# Patient Record
Sex: Male | Born: 1995 | Race: Black or African American | Marital: Single
Health system: Southern US, Community
[De-identification: ages and names within clinical notes are randomized; demographics above are authoritative.]

---

## 2020-04-18 ENCOUNTER — Ambulatory Visit: Payer: Self-pay

## 2020-04-18 ENCOUNTER — Ambulatory Visit (INDEPENDENT_AMBULATORY_CARE_PROVIDER_SITE_OTHER): Payer: BC Managed Care – PPO | Admitting: Orthopedic Surgery

## 2020-04-18 DIAGNOSIS — S83511A Sprain of anterior cruciate ligament of right knee, initial encounter: Secondary | ICD-10-CM | POA: Diagnosis not present

## 2020-04-18 DIAGNOSIS — S83512A Sprain of anterior cruciate ligament of left knee, initial encounter: Secondary | ICD-10-CM

## 2020-04-18 DIAGNOSIS — M25561 Pain in right knee: Secondary | ICD-10-CM | POA: Diagnosis not present

## 2020-04-19 ENCOUNTER — Telehealth: Payer: Self-pay | Admitting: Orthopedic Surgery

## 2020-04-19 NOTE — Telephone Encounter (Signed)
Heather from [facility] called and would like the notes from his doctor visit faxed to [phone]

## 2020-04-19 NOTE — Telephone Encounter (Signed)
Will fax once dictation is available.

## 2020-04-20 ENCOUNTER — Encounter: Payer: Self-pay | Admitting: Orthopedic Surgery

## 2020-04-20 NOTE — Telephone Encounter (Signed)
faxed

## 2020-04-20 NOTE — Progress Notes (Signed)
Office Visit Note   Patient: Marvin Russo           Date of Birth: August 13, 1995           MRN: [id number] Visit Date: 04/18/2020 Requested by: Reather Laurence, DO [address] [place],  [place] [address] PCP: Reather Laurence, DO  Subjective: Chief Complaint  Patient presents with  . Right Knee - Injury    HPI: Marvin Russo is a 25 year old patient who injured his right knee 2 weeks ago.  He injured it playing soccer.  States that his knee buckled inward.  Noncontact injury.  Describes weakness giving way as well as some episodes of instability.  He does like to play soccer and basketball.  He has not played any soccer since his injury.  He is unable to run due to weakness.  Does describe swelling in the right knee after the injury.  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  Notably he also reports left knee injury 2 years ago with similar circumstances.  Does report some occasional pain and symptoms in the left knee.              ROS: All systems reviewed are negative as they relate to the chief complaint within the history of present illness.  Patient denies  fevers or chills.   Assessment & Plan: Visit Diagnoses:  1. Right knee pain, unspecified chronicity   2. 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  Patient is very active.  Reasonably high chance of having meniscal pathology based on the amount of activity he has been doing at least on the left knee with ACL deficiency.  Plan is bilateral knee MRI to evaluate for ACL and meniscal damage.  No posterior lateral rotatory instability today.  Follow-up after the studies.  Strongly advised against any cutting or pivoting activities until return office visit  Follow-Up Instructions: Return for after MRI.   Orders:  Orders Placed This Encounter  Procedures  . XR KNEE 3 VIEW RIGHT  . MR  Knee Right w/o contrast  . MR Knee Left w/o contrast   No orders of the defined types were placed in this encounter.     Procedures: No procedures performed   Clinical Data: No additional findings.  Objective: Vital Signs: There were no vitals taken for this visit.  Physical Exam:   Constitutional: Patient appears well-developed HEENT:  Head: Normocephalic Eyes:EOM are normal Neck: Normal range of motion Cardiovascular: Normal rate Pulmonary/chest: Effort normal Neurologic: Patient is alert Skin: Skin is warm Psychiatric: Patient has normal mood and affect    Ortho Exam: Ortho exam demonstrates ACL laxity in both knees.  Mild effusion right knee no effusion left knee.  Collaterals are stable symmetrically at 0 and 30 degrees.  Extensor mechanism is intact.  Pedal pulses intact bilaterally.  No definite posterior lateral rotatory instability present in either knee.  Does have medial greater than lateral joint line tenderness on the right.  Negative McMurray compression testing bilaterally.  Specialty Comments:  No specialty comments available.  Imaging: No results found.   PMFS History: There are no problems to display for this patient.  History reviewed. No pertinent past medical history.  History reviewed. No pertinent family history.  History reviewed. No pertinent surgical history. Social History   Occupational History  . Not on file  Tobacco Use  . Smoking status: Not on file  . Smokeless tobacco: Not on file  Substance and Sexual Activity  . Alcohol use: Not on file  . Drug use: Not on file  . Sexual activity: Not on file

## 2020-05-08 ENCOUNTER — Ambulatory Visit
Admission: RE | Admit: 2020-05-08 | Discharge: 2020-05-08 | Disposition: A | Discharge status: Home / Self Care | Payer: BC Managed Care – PPO | Source: Ambulatory Visit | Attending: Orthopedic Surgery | Admitting: Orthopedic Surgery

## 2020-05-08 ENCOUNTER — Other Ambulatory Visit: Payer: Self-pay

## 2020-05-08 DIAGNOSIS — M25561 Pain in right knee: Secondary | ICD-10-CM

## 2020-05-08 DIAGNOSIS — S83512A Sprain of anterior cruciate ligament of left knee, initial encounter: Secondary | ICD-10-CM

## 2020-05-08 DIAGNOSIS — S83511A Sprain of anterior cruciate ligament of right knee, initial encounter: Secondary | ICD-10-CM

## 2020-05-09 ENCOUNTER — Telehealth: Payer: Self-pay | Admitting: Orthopedic Surgery

## 2020-05-09 NOTE — Telephone Encounter (Signed)
Patient called needing results of his MRI sooner than 05/23/2020. The number to contact patient is [phone]

## 2020-05-10 NOTE — Telephone Encounter (Signed)
Appt tomorrow the 6th 1:45

## 2020-05-10 NOTE — Telephone Encounter (Signed)
See below. Are we able to get him in anywhere sooner?

## 2020-05-10 NOTE — Telephone Encounter (Signed)
Tried calling pt back to get in sooner, no vm will call back later

## 2020-05-11 ENCOUNTER — Ambulatory Visit: Payer: BC Managed Care – PPO | Admitting: Orthopedic Surgery

## 2020-05-11 ENCOUNTER — Telehealth: Payer: Self-pay

## 2020-05-11 NOTE — Telephone Encounter (Signed)
Please advise 

## 2020-05-11 NOTE — Telephone Encounter (Signed)
Call pt regarding MRI results.  Missed appt

## 2020-05-11 NOTE — Telephone Encounter (Signed)
I called and left message on his machine.  Told him his diagnoses from both MRI scans.  Instructed him to call back if he wants to proceed with intervention or further discussion.  Thanks

## 2020-05-12 ENCOUNTER — Telehealth: Payer: Self-pay | Admitting: Orthopedic Surgery

## 2020-05-12 NOTE — Telephone Encounter (Signed)
Patient called advised he would like to proceed with intervention and also want to further discuss.  Patient said he want to discuss surgery. The number to contact patient is [phone]

## 2020-05-12 NOTE — Telephone Encounter (Signed)
His choice but better to come in imo

## 2020-05-12 NOTE — Telephone Encounter (Signed)
Would you like patient to come in for appt  to review study and discuss SU or would you like to call him & discuss?

## 2020-05-13 NOTE — Telephone Encounter (Signed)
I spoke with patient. He is wanting results over the phone. Can either of you call him to advise?

## 2020-05-13 NOTE — Telephone Encounter (Signed)
lmom 

## 2020-05-23 ENCOUNTER — Ambulatory Visit: Payer: BC Managed Care – PPO | Admitting: Orthopedic Surgery

## 2022-07-24 IMAGING — MR MR KNEE*L* W/O CM
5 of 7 series · 23 of 40 positions shown · non-contrast
Comparison: None.

CLINICAL DATA: Soccer injury

EXAM:
MRI OF THE LEFT KNEE WITHOUT CONTRAST
TECHNIQUE: Multiplanar, multisequence MR imaging of the knee was performed. No
intravenous contrast was administered.

[Series 4: T2 fat-sat · axial · 4.0mm · 0.59mm/px · z∈[-52,+87]mm · 6 of 29 slices shown (1 of 2)]
[im 1/29]
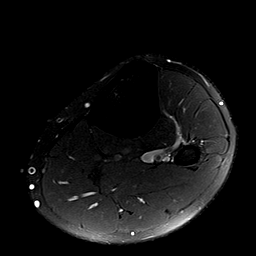
[im 6/29]
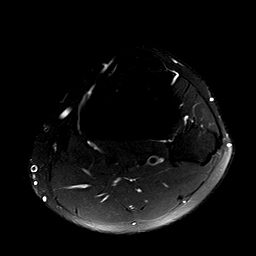
[im 12/29]
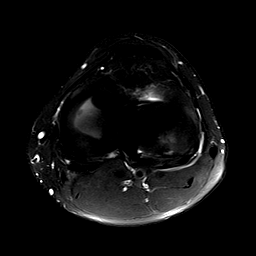
[im 17/29]
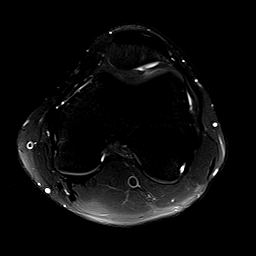
[im 23/29]
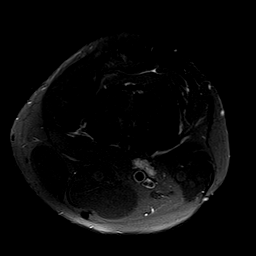
[im 29/29]
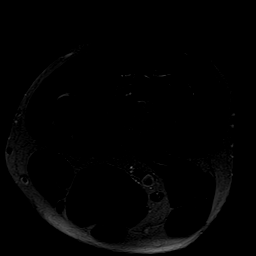

[Series 6: T2 fat-sat · coronal · 4.0mm · 0.29mm/px · 2 of 29 slices shown (2 of 2)]
[im 1/29]
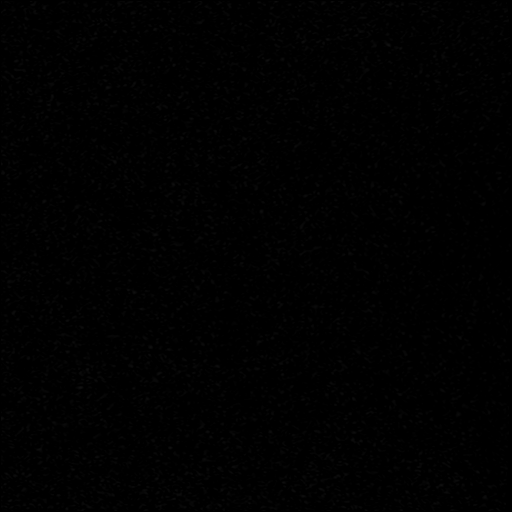
[im 6/29]
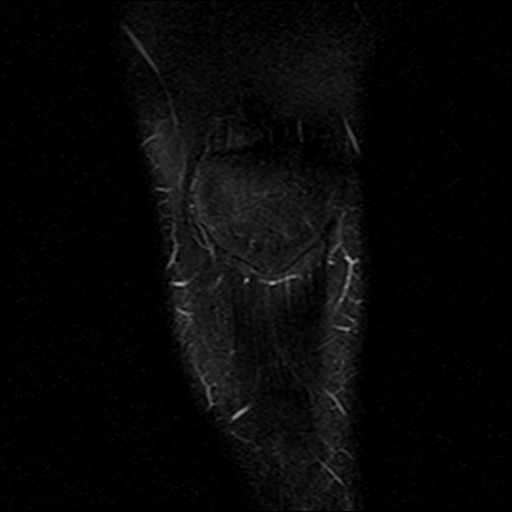

[Series 7: PD fat-sat · coronal · 4.0mm · 0.29mm/px · 6 of 29 slices shown (1 of 3)]
[im 1/29]
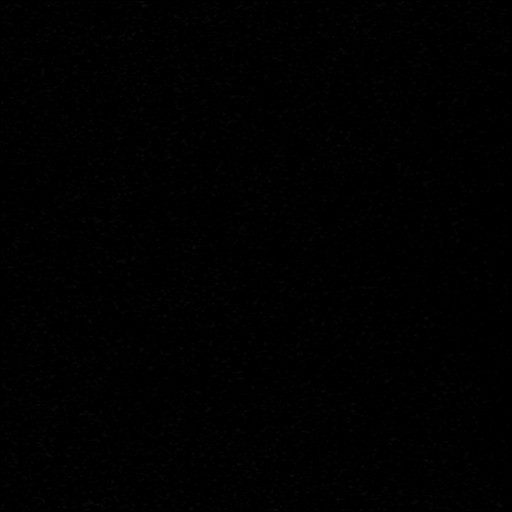
[im 6/29]
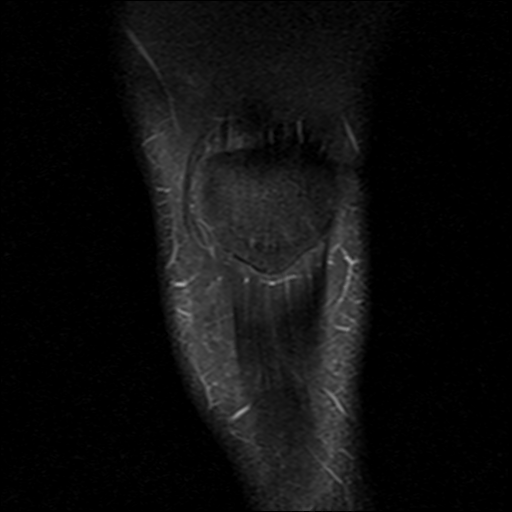
[im 12/29]
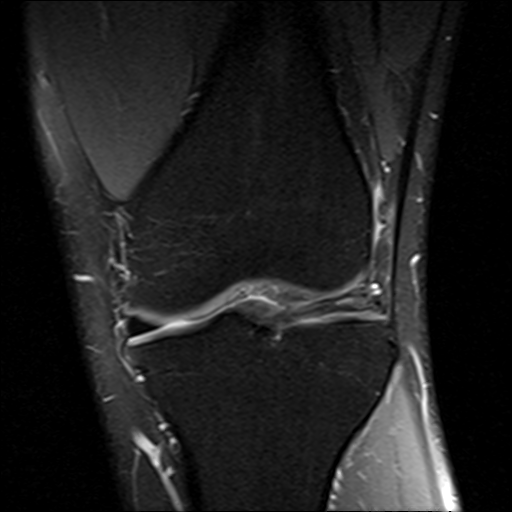
[im 17/29]
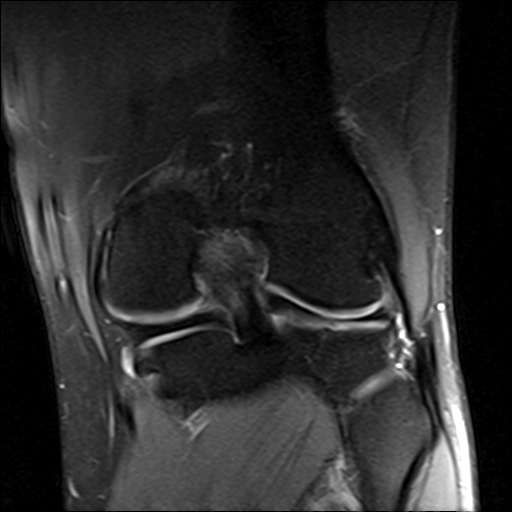
[im 23/29]
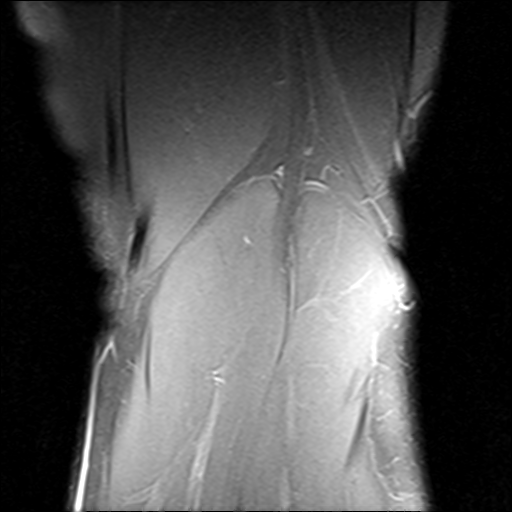
[im 29/29]
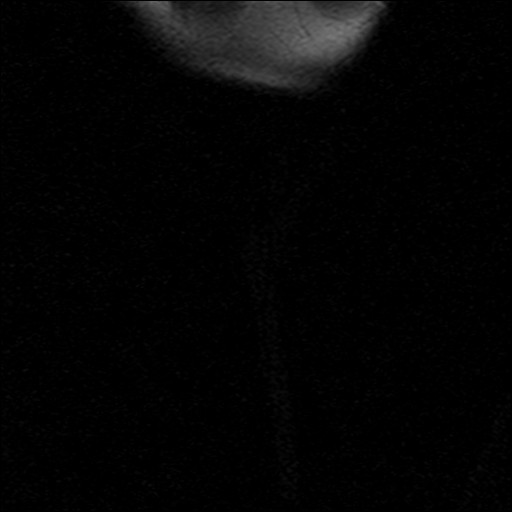

[Series 9: PD fat-sat · sagittal · 3.0mm · 0.29mm/px · 7 of 35 slices shown (2 of 3)]
[im 1/35]
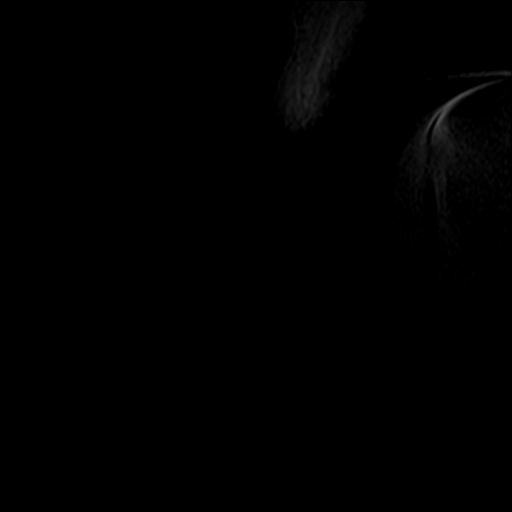
[im 6/35]
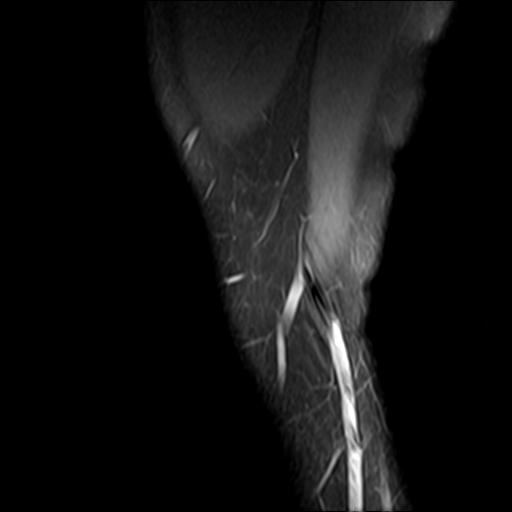
[im 12/35]
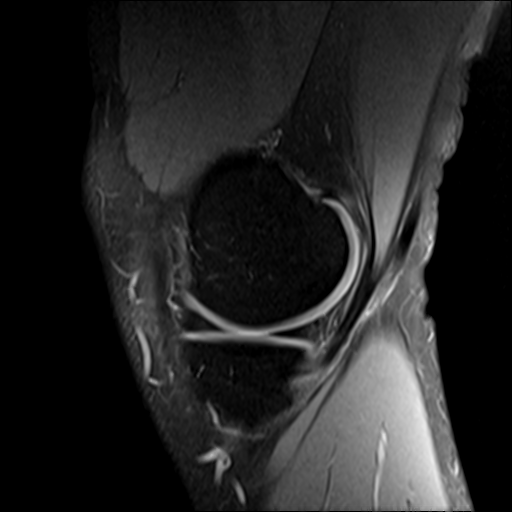
[im 18/35]
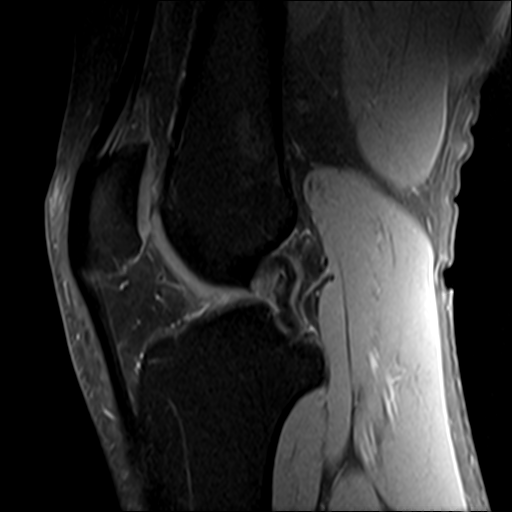
[im 23/35]
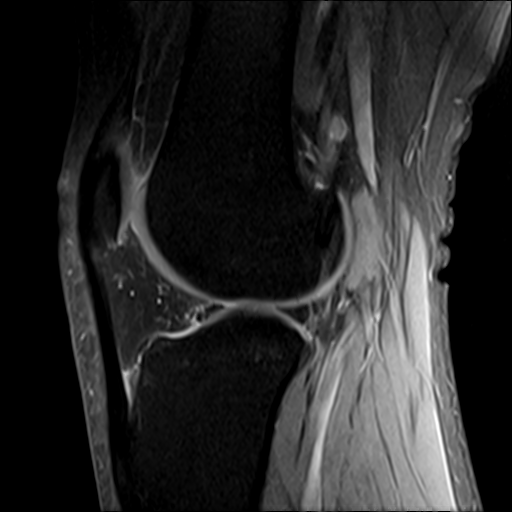
[im 29/35]
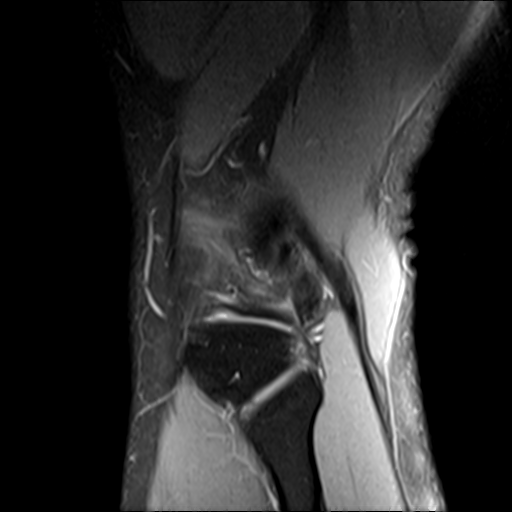
[im 35/35]
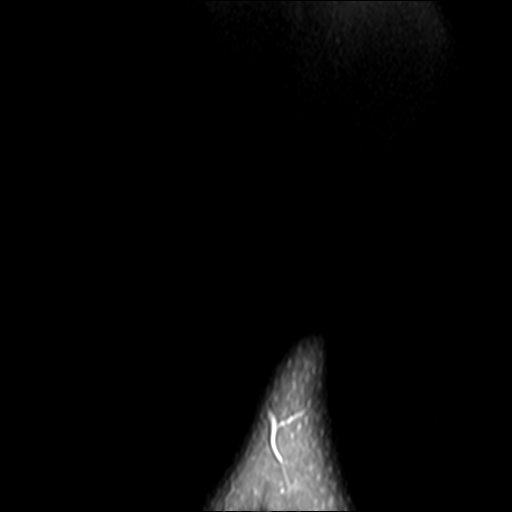

[Series 10: PD fat-sat · oblique · 2.0mm · 0.29mm/px · 2 of 11 slices shown (3 of 3)]
[im 1/11]
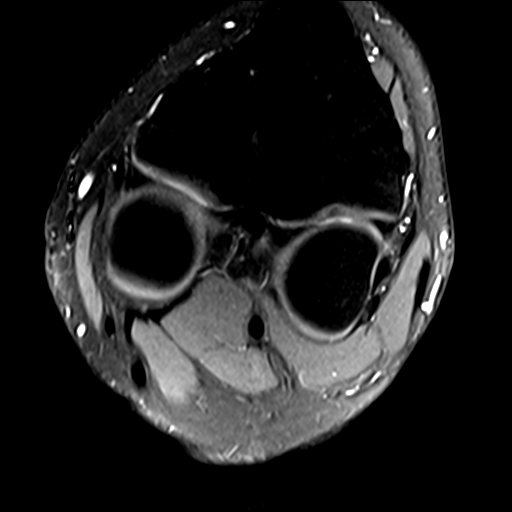
[im 11/11]
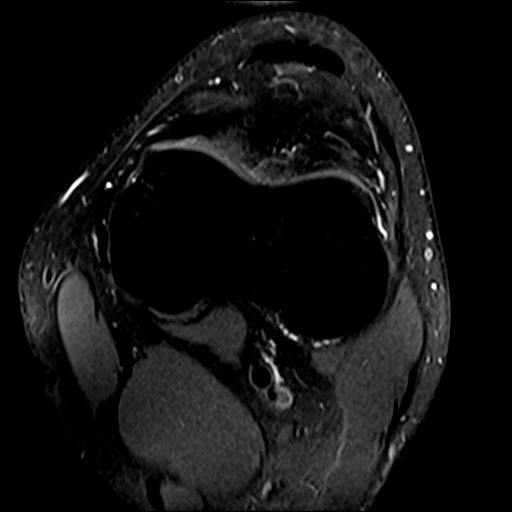

[23 of 40 positions shown; findings below may reference images not displayed]

FINDINGS: MENISCI

Medial meniscus: Faint vertical signal in the periphery of the
posterior horn is not a definitive tear.

Lateral meniscus: Mild free edge irregularity along the anterior
horn. Peripheral vertical signal in the posterior horn for example
on image 11 of series 9 extends further laterally than would be
expected for the take-off of a or some or Chadwin ligament, and
raises suspicion for a peripheral vertical tear of the lateral
meniscus.

LIGAMENTS

Cruciates: Poor visibility of the anterior cruciate ligament, likely
deficient/chronically torn. The PCL appears intact.

Collaterals:  Unremarkable

CARTILAGE

Patellofemoral:  Unremarkable

Medial:  Unremarkable

Lateral: 1.0 by 0.6 cm degenerative osteochondral lesion of the
lateral tibial plateau with a small subcortical cyst component and
mild heterogeneity of the overlying articular cartilage. Small focus
of subcortical marrow edema anteriorly in the lateral femoral
condyle on image 9 series 9.

Joint:  Unremarkable

Popliteal Fossa:  Unremarkable

Extensor Mechanism:  Unremarkable

Bones: No significant extra-articular osseous abnormalities
identified.

Other: No supplemental non-categorized findings.
IMPRESSION: 1. Grade 3 vertical signal in the periphery of the posterior horn
lateral meniscus suspicious for a peripheral vertical tear.
2. Highly indistinct/deficient anterior cruciate ligament suspicious
for chronic ACL tear. Correlate with physical exam findings.
3. Small degenerative osteochondral lesions in the lateral
compartment.
